# Patient Record
Sex: Male | Born: 1995 | Hispanic: Yes | Marital: Single | Smoking: Current every day smoker
Health system: Southern US, Community
[De-identification: ages and names within clinical notes are randomized; demographics above are authoritative.]

## PROBLEM LIST (undated history)

## (undated) DIAGNOSIS — S52502A Unspecified fracture of the lower end of left radius, initial encounter for closed fracture: Secondary | ICD-10-CM

## (undated) HISTORY — PX: NO PAST SURGERIES: SHX2092

---

## 2016-04-05 ENCOUNTER — Emergency Department (HOSPITAL_BASED_OUTPATIENT_CLINIC_OR_DEPARTMENT_OTHER)
Admission: EM | Admit: 2016-04-05 | Discharge: 2016-04-06 | Disposition: A | Discharge status: Home / Self Care | Payer: Self-pay | Attending: Emergency Medicine | Admitting: Emergency Medicine

## 2016-04-05 ENCOUNTER — Encounter (HOSPITAL_BASED_OUTPATIENT_CLINIC_OR_DEPARTMENT_OTHER): Payer: Self-pay | Admitting: Emergency Medicine

## 2016-04-05 DIAGNOSIS — F17200 Nicotine dependence, unspecified, uncomplicated: Secondary | ICD-10-CM | POA: Insufficient documentation

## 2016-04-05 DIAGNOSIS — Y939 Activity, unspecified: Secondary | ICD-10-CM | POA: Insufficient documentation

## 2016-04-05 DIAGNOSIS — S52502A Unspecified fracture of the lower end of left radius, initial encounter for closed fracture: Secondary | ICD-10-CM | POA: Insufficient documentation

## 2016-04-05 DIAGNOSIS — Y929 Unspecified place or not applicable: Secondary | ICD-10-CM | POA: Insufficient documentation

## 2016-04-05 DIAGNOSIS — Q7192 Unspecified reduction defect of left upper limb: Secondary | ICD-10-CM

## 2016-04-05 DIAGNOSIS — W109XXA Fall (on) (from) unspecified stairs and steps, initial encounter: Secondary | ICD-10-CM | POA: Insufficient documentation

## 2016-04-05 DIAGNOSIS — Y999 Unspecified external cause status: Secondary | ICD-10-CM | POA: Insufficient documentation

## 2016-04-05 HISTORY — DX: Unspecified fracture of the lower end of left radius, initial encounter for closed fracture: S52.502A

## 2016-04-05 MED ORDER — ONDANSETRON HCL 4 MG/2ML IJ SOLN
4.0000 mg | Freq: Once | INTRAMUSCULAR | Status: AC
  Administered 2016-04-05: 4 mg via INTRAVENOUS
  Filled 2016-04-05: qty 2

## 2016-04-05 MED ORDER — MORPHINE SULFATE (PF) 4 MG/ML IV SOLN
4.0000 mg | Freq: Once | INTRAVENOUS | Status: AC
  Administered 2016-04-06: 4 mg via INTRAVENOUS
  Filled 2016-04-05: qty 1

## 2016-04-05 NOTE — ED Notes (Addendum)
MD at bedside. 

## 2016-04-05 NOTE — ED Triage Notes (Signed)
Pt in c/o pain to L wrist after falling on the stairs approx 30 min PTA. Obvious deformity to L wrist. Pt alert, interactive, in NAD.

## 2016-04-05 NOTE — ED Provider Notes (Signed)
[facility] Note   CSN: [id number] Arrival date & time: 04/05/16  2339  By signing my name below, I, Phillis Haggis, attest that this documentation has been prepared under the direction and in the presence of Cathren Laine, MD. Electronically Signed: Phillis Haggis, ED Scribe. 04/05/16. 11:52 PM.  History   Chief Complaint Chief Complaint  Patient presents with  . Wrist Injury    The history is provided by the patient. No language interpreter was used.  HPI Comments: Kyle Anderson is a 20 y.o. male who presents to the Emergency Department complaining of a fall and left wrist injury occurring 30 minutes ago. Pt reports that he fell down some stairs and injured the wrist. There is an obvious deformity to the left wrist with associated swelling and pain. Pain moderate-severe, constant, dull, worse w movement. Pt is right hand dominant. He denies hitting head, chest pain, SOB, neck pain, back pain, dizziness, lightheadedness, numbness, weakness, or LOC. Pt denies allergies to medication.   History reviewed. No pertinent past medical history.  There are no active problems to display for this patient.   History reviewed. No pertinent surgical history.     Home Medications    Prior to Admission medications   Not on File    Family History History reviewed. No pertinent family history.  Social History Social History  Substance Use Topics  . Smoking status: Current Every Day Smoker  . Smokeless tobacco: Not on file  . Alcohol use No    Allergies   Review of patient's allergies indicates no known allergies.  Review of Systems Review of Systems  Constitutional: Negative for fever.  Respiratory: Negative for shortness of breath.   Cardiovascular: Negative for chest pain.  Gastrointestinal: Negative for abdominal pain.  Musculoskeletal: Positive for arthralgias and joint swelling.  Skin: Negative for wound.  Neurological: Negative for syncope, weakness,  numbness and headaches.  All other systems reviewed and are negative.    Physical Exam Updated Vital Signs BP 153/90   Pulse 80   Temp 98.1 F (36.7 C)   Resp 20   Wt 120 lb (54.4 kg)   SpO2 100%   Physical Exam  Constitutional: He appears well-developed and well-nourished.  HENT:  Head: Normocephalic and atraumatic.  Eyes: EOM are normal. Pupils are equal, round, and reactive to light.  Neck: Normal range of motion. Neck supple.  Cardiovascular: Normal rate, regular rhythm and normal heart sounds.  Exam reveals no gallop and no friction rub.   No murmur heard. Pulmonary/Chest: Effort normal and breath sounds normal. He has no wheezes.  Abdominal: Soft. There is no tenderness.  Musculoskeletal: Normal range of motion.       Left wrist: He exhibits tenderness, swelling and deformity.  CTLS spine, non tender, aligned, no step off.  Deformity to left wrist, skin is intact. Radial pulse 2+. Normal cap refill distally. No other focal bony tenderness on bil ext exam.    Neurological: He is alert.  LUE, rad/med/uln n fxn, motor and sens intact.   Ambulates w steady gait.   Skin: Skin is warm and dry.  Intact.   Psychiatric: He has a normal mood and affect. His behavior is normal.  Nursing note and vitals reviewed.  ED Treatments / Results  DIAGNOSTIC STUDIES: Oxygen Saturation is 100% on RA, normal by my interpretation.    COORDINATION OF CARE: 11:50 PM-Discussed treatment plan which includes x-ray with pt at bedside and pt agreed to plan.    Labs (  all labs ordered are listed, but only abnormal results are displayed) Labs Reviewed - No data to display  EKG  EKG Interpretation None       Radiology Dg Wrist 2 Views Left  Result Date: 04/06/2016 CLINICAL DATA:  Status post reduction of left wrist deformity. Initial encounter. EXAM: LEFT WRIST - 2 VIEW COMPARISON:  Left wrist radiographs performed earlier today at 12:01 a.m. FINDINGS: There is improved alignment of the  distal radial fracture, though approximately 1/2 shaft width dorsal displacement is still seen, with mild dorsal angulation and mild impaction. No new fractures are identified. The carpal rows appear grossly intact, and demonstrate normal alignment. Mild soft tissue swelling is noted about the wrist. Evaluation of the soft tissues is somewhat suboptimal due to the overlying splint. IMPRESSION: Improved alignment of distal radius fracture, though approximately 1/2 shaft width dorsal displacement is still seen, with mild dorsal angulation and mild impaction. Electronically Signed   By: Roanna RaiderJeffery  Chang M.D.   On: 04/06/2016 01:55   Dg Wrist Complete Left  Result Date: 04/06/2016 CLINICAL DATA:  20 year old male with fall. EXAM: LEFT WRIST - COMPLETE 3+ VIEW COMPARISON:  None. FINDINGS: There is a fracture of the distal radius with approximately half shaft thickness dorsal displacement and dorsal angulation of the distal fracture fragment. No other acute fracture identified. There is soft tissue swelling of the distal forearm and wrist. No radiopaque foreign object. IMPRESSION: Fracture of the distal radius with dorsal displacement and angulation of the distal fracture fragment. Electronically Signed   By: Elgie CollardArash  Radparvar M.D.   On: 04/06/2016 00:56    Procedures Procedures (including critical care time)  Medications Ordered in ED Medications - No data to display   Initial Impression / Assessment and Plan / ED Course  I have reviewed the triage vital signs and the nursing notes.  Pertinent labs & imaging results that were available during my care of the patient were reviewed by me and considered in my medical decision making (see chart for details).  Clinical Course    Iv ns. Morphine iv.  Xrays.  Reviewed nursing notes.  Hand surgery on call consulted, spoke with Dr Izora Ribasoley - he indicated if we were able to get improvement alignment, to splint and have f/u in the office in the next 1-2 days.     Pain improved, but persists. Dilaudid 1 mg iv.  Pain improved.  Hematoma block, sterile prep w betadine, block accomplished with 2% lidocaine, total 10 cc used. Good result. Pt tolerated reduction attempt well.  Skin remains intact. Radial pulse 2+  Post reduction, improved alignment from initial xray, however will still require surgical reduction/repair. sugartong splint applied by tech.    Recheck post splint, pain improved.  Radial pulse 2+, normal cap refill distally in digits. No numbness. Left hand, rad/med/uln n fxn intact, motor and sens  Patient currently stable for d/c.    Final Clinical Impressions(s) / ED Diagnoses   Final diagnoses:  None  I personally performed the services described in this documentation, which was scribed in my presence. The recorded information has been reviewed and considered. Cathren LaineKevin Jamare Vanatta, MD   New Prescriptions New Prescriptions   No medications on file     Cathren LaineKevin Sanjana Folz, MD 04/06/16 0330

## 2016-04-06 ENCOUNTER — Emergency Department (HOSPITAL_COMMUNITY)
Admission: EM | Admit: 2016-04-06 | Discharge: 2016-04-06 | Disposition: A | Discharge status: Home / Self Care | Payer: Self-pay | Attending: Emergency Medicine | Admitting: Emergency Medicine

## 2016-04-06 ENCOUNTER — Emergency Department (HOSPITAL_BASED_OUTPATIENT_CLINIC_OR_DEPARTMENT_OTHER): Payer: Self-pay

## 2016-04-06 ENCOUNTER — Encounter (HOSPITAL_COMMUNITY): Payer: Self-pay

## 2016-04-06 DIAGNOSIS — M25531 Pain in right wrist: Secondary | ICD-10-CM | POA: Insufficient documentation

## 2016-04-06 DIAGNOSIS — M25532 Pain in left wrist: Secondary | ICD-10-CM

## 2016-04-06 DIAGNOSIS — W19XXXD Unspecified fall, subsequent encounter: Secondary | ICD-10-CM | POA: Insufficient documentation

## 2016-04-06 DIAGNOSIS — F17210 Nicotine dependence, cigarettes, uncomplicated: Secondary | ICD-10-CM | POA: Insufficient documentation

## 2016-04-06 MED ORDER — HYDROCODONE-ACETAMINOPHEN 5-325 MG PO TABS: 1.0000 | ORAL_TABLET | Freq: Four times a day (QID) | ORAL | 0 refills | Status: DC | PRN

## 2016-04-06 MED ORDER — HYDROCODONE-ACETAMINOPHEN 5-325 MG PO TABS
2.0000 | ORAL_TABLET | Freq: Once | ORAL | Status: AC
  Administered 2016-04-06: 2 via ORAL
  Filled 2016-04-06: qty 2

## 2016-04-06 MED ORDER — HYDROMORPHONE HCL 1 MG/ML IJ SOLN
1.0000 mg | Freq: Once | INTRAMUSCULAR | Status: AC
  Administered 2016-04-06: 1 mg via INTRAVENOUS
  Filled 2016-04-06: qty 1

## 2016-04-06 MED ORDER — LIDOCAINE HCL 2 % IJ SOLN
20.0000 mL | Freq: Once | INTRAMUSCULAR | Status: AC
  Administered 2016-04-06: 400 mg
  Filled 2016-04-06: qty 20

## 2016-04-06 MED ORDER — MORPHINE SULFATE (PF) 4 MG/ML IV SOLN
4.0000 mg | Freq: Once | INTRAVENOUS | Status: AC
  Administered 2016-04-06: 4 mg via INTRAVENOUS
  Filled 2016-04-06: qty 1

## 2016-04-06 NOTE — ED Notes (Signed)
Pt fell down steps. Presents with obvious deformity to left wrist. Fingers cool. Radial pulse present per EDP. Moves fingers and thumb slightly. Extremity splinted and ice applied.

## 2016-04-06 NOTE — Discharge Instructions (Signed)
It was our pleasure to provide your ER care today - we hope that you feel better.  Keep splint clean and dry.  Keep your wrist elevated above heart level, as much as possible to help with pain and swelling. Icepack/cold to sore area.  Take motrin or aleve as need for pain. You may also take hydrocodone as need for pain. No driving for the next 6 hours or when taking hydrocodone. Also, do not take tylenol or acetaminophen containing medication when taking hydrocodone.  You will require surgery in order that your wrist injury/fracture heals, and so that you have as close to normal function of your wrist and hand as possible - as such, you must follow up with hand/wrist specialist in the next couple days - see referral - call office this AM to arrange appointment.   When you call the office, tell them that you were in the ER tonight, and that we discussed your case with Dr Izora Ribasoley.  Your blood pressure is high tonight - follow up with primary care doctor in the next 2-3 weeks for recheck.  Return to ER if worse, new symptoms, numbness/weakness, intractable pain, other concern.

## 2016-04-06 NOTE — ED Triage Notes (Addendum)
Pt reports he fell last night and had wrist injury. [facility] Pt reports pain and tingling in the finger at this time. Pt reports he has pain and as unable to pick up his prescription. Pt able to move fingers, good cap refill noted, no abnormal color of the digits.

## 2016-04-06 NOTE — ED Notes (Signed)
Declined W/C at D/C and was escorted to lobby by RN. 

## 2016-04-06 NOTE — ED Notes (Signed)
Patient transported to X-ray 

## 2016-04-06 NOTE — ED Notes (Signed)
MD at bedside. 

## 2016-04-06 NOTE — ED Notes (Signed)
Pt c/o pain. Rates pain 10/10. MD aware and orders received.

## 2016-04-06 NOTE — ED Notes (Signed)
Juice given

## 2016-04-06 NOTE — Progress Notes (Signed)
[facility].  Per NP Katrinka BlazingSmith, patient tried to make appointment today but was denied due to patient not having large sum of money to pay up front.  Patient without insurance.  NP reports patient will need surgery and that Dr. Izora Ribasoley wanted to see patient in his office for follow up.  [facility] Dr. Izora Ribasoley to see if he could assist in this matter.  No further EDCM needs at this time.

## 2016-04-06 NOTE — ED Provider Notes (Signed)
[facility] Provider Note   CSN: [id number] Arrival date & time: 04/06/16  1410   By signing my name below, I, Clovis Pu, attest that this documentation has been prepared under the direction and in the presence of  Felicie Morn, NP. Electronically Signed: Clovis Pu, ED Scribe. 04/06/16. 5:11 PM.   History   Chief Complaint Chief Complaint  Patient presents with  . Arm Pain     The history is provided by the patient. No language interpreter was used.  Arm Pain  This is a new problem. The current episode started yesterday. The problem has not changed since onset.  HPI Comments:  Kyle Anderson is a 20 y.o. male who presents to the Emergency Department complaining of mild numbness and tingling to his fingers of his LUE. Pt denies hitting his hand on anything since his recent wrist injury. He visited [facility] and was seen by Dr. Denton Lank for the wrist injury and was referred to Dr. Izora Ribas for follow up within the next 1-2 days. Pt states he called Dr. Izora Ribas and again today and was told he could not come in if he did not have any means to pay. He has not picked up his prescription medication to alleviate his pain.   History reviewed. No pertinent past medical history.  There are no active problems to display for this patient.   History reviewed. No pertinent surgical history.     Home Medications    Prior to Admission medications   Medication Sig Start Date End Date Taking? Authorizing Provider  HYDROcodone-acetaminophen (NORCO/VICODIN) 5-325 MG tablet Take 1-2 tablets by mouth every 6 (six) hours as needed for moderate pain. 04/06/16   Cathren Laine, MD    Family History History reviewed. No pertinent family history.  Social History Social History  Substance Use Topics  . Smoking status: Current Every Day Smoker    Packs/day: 0.50    Types: Cigarettes  . Smokeless tobacco: Never Used  . Alcohol use No     Allergies   Review of patient's allergies indicates no  known allergies.   Review of Systems Review of Systems  Constitutional: Negative for fever.  Neurological: Negative for numbness.  All other systems reviewed and are negative.    Physical Exam Updated Vital Signs BP 138/80 (BP Location: Left Arm)   Pulse 89   Temp 98.5 F (36.9 C) (Oral)   Resp 16   SpO2 98%   Physical Exam  Constitutional: He is oriented to person, place, and time. He appears well-developed and well-nourished. No distress.  HENT:  Head: Normocephalic and atraumatic.  Eyes: Conjunctivae are normal.  Cardiovascular: Normal rate.   Pulmonary/Chest: Effort normal.  Abdominal: He exhibits no distension.  Musculoskeletal:  Intact splint on L forearm. Sensation and movement intact. Pt reports mild numbness.   Neurological: He is alert and oriented to person, place, and time.  Skin: Skin is warm and dry.   Brisk capillary refill.   Psychiatric: He has a normal mood and affect.  Nursing note and vitals reviewed.    ED Treatments / Results  DIAGNOSTIC STUDIES:  Oxygen Saturation is 98% on RA, normal by my interpretation.    COORDINATION OF CARE:  5:04 PM Discussed treatment plan with pt at bedside and pt agreed to plan.  Labs (all labs ordered are listed, but only abnormal results are displayed) Labs Reviewed - No data to display  EKG  EKG Interpretation None       Radiology Dg Wrist 2 Views  Left  Result Date: 04/06/2016 CLINICAL DATA:  Status post reduction of left wrist deformity. Initial encounter. EXAM: LEFT WRIST - 2 VIEW COMPARISON:  Left wrist radiographs performed earlier today at 12:01 a.m. FINDINGS: There is improved alignment of the distal radial fracture, though approximately 1/2 shaft width dorsal displacement is still seen, with mild dorsal angulation and mild impaction. No new fractures are identified. The carpal rows appear grossly intact, and demonstrate normal alignment. Mild soft tissue swelling is noted about the wrist.  Evaluation of the soft tissues is somewhat suboptimal due to the overlying splint. IMPRESSION: Improved alignment of distal radius fracture, though approximately 1/2 shaft width dorsal displacement is still seen, with mild dorsal angulation and mild impaction. Electronically Signed   By: Roanna RaiderJeffery  Chang M.D.   On: 04/06/2016 01:55   Dg Wrist Complete Left  Result Date: 04/06/2016 CLINICAL DATA:  20 year old male with fall. EXAM: LEFT WRIST - COMPLETE 3+ VIEW COMPARISON:  None. FINDINGS: There is a fracture of the distal radius with approximately half shaft thickness dorsal displacement and dorsal angulation of the distal fracture fragment. No other acute fracture identified. There is soft tissue swelling of the distal forearm and wrist. No radiopaque foreign object. IMPRESSION: Fracture of the distal radius with dorsal displacement and angulation of the distal fracture fragment. Electronically Signed   By: Elgie CollardArash  Radparvar M.D.   On: 04/06/2016 00:56    Procedures Procedures (including critical care time)  Medications Ordered in ED Medications - No data to display   Initial Impression / Assessment and Plan / ED Course  I have reviewed the triage vital signs and the nursing notes.  Pertinent labs & imaging results that were available during my care of the patient were reviewed by me and considered in my medical decision making (see chart for details).  Clinical Course  Patient was seen yesterday for treatment of wrist fracture. Reduction was performed and splint applied. Patient reporting mild tingling of fingers today. Hand and fingers are warm to touch, brisk capillary refill. Normal sensation. Splint assessed and re-secured by orthopedic tech.  Patient to follow-up with Dr. Izora Ribasoley. Case management will assist patient in setting up appointment.    Final Clinical Impressions(s) / ED Diagnoses   Final diagnoses:  Left wrist pain    New Prescriptions New Prescriptions   No medications on  file  I personally performed the services described in this documentation, which was scribed in my presence. The recorded information has been reviewed and is accurate.     Felicie Mornavid Helmut Hennon, NP 04/07/16 [id number]    Loren Raceravid Yelverton, MD 04/08/16 [phone]

## 2016-04-08 ENCOUNTER — Telehealth: Payer: Self-pay | Admitting: General Practice

## 2016-04-10 ENCOUNTER — Other Ambulatory Visit: Payer: Self-pay | Admitting: Orthopedic Surgery

## 2016-04-10 ENCOUNTER — Encounter (HOSPITAL_BASED_OUTPATIENT_CLINIC_OR_DEPARTMENT_OTHER): Payer: Self-pay | Admitting: *Deleted

## 2016-04-11 NOTE — H&P (Signed)
Kyle GoldsRuben Bonenberger is an 20 y.o. male.   Chief Complaint: left wrist injury HPI: This patient is a 20 year old male who fell down some steps on 04-05-16, injuring his left wrist.  He presented to the emergency department for evaluation, was provisionally reduced and placed into a splint.  He had difficulty obtaining follow-up, and return to the emergency department when I was on call.  He was referred to my office, and was unable to keep appointments that were scheduled for him on Thursday, 04-09-16 and also on Friday, 04-10-16.  I spoke with him via telephone on Friday, and we discussed the particulars of his injury, options for treatment, etc. and he indicated that he did wish to proceed with operative treatment and was comfortable with us meeting face-to-face for the first time on the day of surgery.  Past Medical History:  Diagnosis Date  . Distal radius fracture, left 04/05/2016    Past Surgical History:  Procedure Laterality Date  . NO PAST SURGERIES      History reviewed. No pertinent family history. Social History:  reports that he has been smoking Cigarettes.  He has a 1.50 pack-year smoking history. He has never used smokeless tobacco. He reports that he drinks alcohol. He reports that he does not use drugs.  Allergies: No Known Allergies  No prescriptions prior to admission.    No results found for this or any previous visit (from the past 48 hour(s)). No results found.  Review of Systems  All other systems reviewed and are negative.   Height 5\' 10"  (1.778 m), weight 59 kg (130 lb). Physical Exam  Constitutional: He is oriented to person, place, and time. He appears well-developed and well-nourished.  HENT:  Head: Normocephalic and atraumatic.  Eyes: Pupils are equal, round, and reactive to light.  Neck: Normal range of motion.  Cardiovascular: Intact distal pulses.   Respiratory: Effort normal and breath sounds normal.  GI: Soft.  Musculoskeletal:  Plint in place on left  upper extremity. Digit swollen, with some subjective numbness, but intact light touch sensibility  In the radial, median, and ulnar nerve distributions with intact motor to the same.  No tenderness about the elbow.  Once the splint was removed, the wrist was noted to be swollen and tender at the distal radius with visible degree of deformity.  Neurological: He is alert and oriented to person, place, and time. He has normal reflexes.  Skin: Skin is warm and dry.  Psychiatric: He has a normal mood and affect. His behavior is normal. Judgment and thought content normal.     Assessment/Plan This patient has a comminuted and dorsally displaced left distal radius fracture that is closed.  I again reviewed with him the indications for surgery to obtain and maintain a more anatomic alignment so that he can optimize restoration of function. The details of the operative procedure were again discussed with the patient.  Questions were invited and answered.  In addition to the goal of the procedure, the risks of the procedure to include but not limited to bleeding; infection; damage to the nerves or blood vessels that could result in bleeding, numbness, weakness, chronic pain, and the need for additional procedures; stiffness; the need for revision surgery; and anesthetic risks were reviewed.  No specific outcome was guaranteed or implied.  Informed consent was obtained.  Riven Mabile A., MD 04/11/2016, 9:14 AM

## 2016-04-13 ENCOUNTER — Ambulatory Visit (HOSPITAL_BASED_OUTPATIENT_CLINIC_OR_DEPARTMENT_OTHER)
Admission: RE | Admit: 2016-04-13 | Discharge: 2016-04-13 | Disposition: A | Discharge status: Home / Self Care | Payer: Self-pay | Source: Ambulatory Visit | Attending: Orthopedic Surgery | Admitting: Orthopedic Surgery

## 2016-04-13 ENCOUNTER — Ambulatory Visit (HOSPITAL_BASED_OUTPATIENT_CLINIC_OR_DEPARTMENT_OTHER): Payer: Self-pay | Admitting: Certified Registered"

## 2016-04-13 ENCOUNTER — Encounter (HOSPITAL_BASED_OUTPATIENT_CLINIC_OR_DEPARTMENT_OTHER)
Admission: RE | Disposition: A | Discharge status: Home / Self Care | Payer: Self-pay | Source: Ambulatory Visit | Attending: Orthopedic Surgery

## 2016-04-13 ENCOUNTER — Encounter (HOSPITAL_BASED_OUTPATIENT_CLINIC_OR_DEPARTMENT_OTHER): Payer: Self-pay

## 2016-04-13 ENCOUNTER — Ambulatory Visit (HOSPITAL_COMMUNITY): Payer: Self-pay

## 2016-04-13 DIAGNOSIS — F17200 Nicotine dependence, unspecified, uncomplicated: Secondary | ICD-10-CM | POA: Insufficient documentation

## 2016-04-13 DIAGNOSIS — S59202A Unspecified physeal fracture of lower end of radius, left arm, initial encounter for closed fracture: Secondary | ICD-10-CM | POA: Insufficient documentation

## 2016-04-13 DIAGNOSIS — W109XXA Fall (on) (from) unspecified stairs and steps, initial encounter: Secondary | ICD-10-CM | POA: Insufficient documentation

## 2016-04-13 DIAGNOSIS — T148XXA Other injury of unspecified body region, initial encounter: Secondary | ICD-10-CM

## 2016-04-13 HISTORY — PX: OPEN REDUCTION INTERNAL FIXATION (ORIF) DISTAL RADIAL FRACTURE: SHX5989

## 2016-04-13 HISTORY — DX: Unspecified fracture of the lower end of left radius, initial encounter for closed fracture: S52.502A

## 2016-04-13 SURGERY — OPEN REDUCTION INTERNAL FIXATION (ORIF) DISTAL RADIUS FRACTURE
Anesthesia: General | Site: Wrist | Laterality: Left

## 2016-04-13 MED ORDER — GLYCOPYRROLATE 0.2 MG/ML IJ SOLN
0.2000 mg | Freq: Once | INTRAMUSCULAR | Status: DC | PRN
Start: 2016-04-13 — End: 2016-04-13

## 2016-04-13 MED ORDER — LIDOCAINE HCL (CARDIAC) 20 MG/ML IV SOLN
INTRAVENOUS | Status: DC | PRN
  Administered 2016-04-13: 30 mg via INTRAVENOUS

## 2016-04-13 MED ORDER — MIDAZOLAM HCL 2 MG/2ML IJ SOLN
1.0000 mg | INTRAMUSCULAR | Status: DC | PRN
  Administered 2016-04-13: 2 mg via INTRAVENOUS
  Administered 2016-04-13: 1 mg via INTRAVENOUS

## 2016-04-13 MED ORDER — LACTATED RINGERS IV SOLN: INTRAVENOUS | Status: DC

## 2016-04-13 MED ORDER — OXYCODONE HCL 5 MG PO TABS: 5.0000 mg | ORAL_TABLET | Freq: Once | ORAL | Status: DC | PRN

## 2016-04-13 MED ORDER — 0.9 % SODIUM CHLORIDE (POUR BTL) OPTIME
TOPICAL | Status: DC | PRN
  Administered 2016-04-13: 1000 mL

## 2016-04-13 MED ORDER — DEXAMETHASONE SODIUM PHOSPHATE 10 MG/ML IJ SOLN
INTRAMUSCULAR | Status: DC | PRN
  Administered 2016-04-13: 10 mg via INTRAVENOUS

## 2016-04-13 MED ORDER — ONDANSETRON HCL 4 MG/2ML IJ SOLN
INTRAMUSCULAR | Status: DC | PRN
  Administered 2016-04-13: 4 mg via INTRAVENOUS

## 2016-04-13 MED ORDER — DEXAMETHASONE SODIUM PHOSPHATE 10 MG/ML IJ SOLN
INTRAMUSCULAR | Status: AC
  Filled 2016-04-13: qty 1

## 2016-04-13 MED ORDER — MIDAZOLAM HCL 2 MG/2ML IJ SOLN
INTRAMUSCULAR | Status: AC
  Filled 2016-04-13: qty 2

## 2016-04-13 MED ORDER — CEFAZOLIN SODIUM-DEXTROSE 2-4 GM/100ML-% IV SOLN
2.0000 g | INTRAVENOUS | Status: AC
  Administered 2016-04-13: 2 g via INTRAVENOUS

## 2016-04-13 MED ORDER — MIDAZOLAM HCL 2 MG/2ML IJ SOLN
INTRAMUSCULAR | Status: AC
Start: 2016-04-13 — End: 2016-04-13
  Filled 2016-04-13: qty 2

## 2016-04-13 MED ORDER — PROPOFOL 10 MG/ML IV BOLUS
INTRAVENOUS | Status: DC | PRN
  Administered 2016-04-13: 200 mg via INTRAVENOUS

## 2016-04-13 MED ORDER — SCOPOLAMINE 1 MG/3DAYS TD PT72: 1.0000 | MEDICATED_PATCH | Freq: Once | TRANSDERMAL | Status: DC | PRN

## 2016-04-13 MED ORDER — ONDANSETRON HCL 4 MG/2ML IJ SOLN
INTRAMUSCULAR | Status: AC
  Filled 2016-04-13: qty 2

## 2016-04-13 MED ORDER — OXYCODONE HCL 5 MG/5ML PO SOLN: 5.0000 mg | Freq: Once | ORAL | Status: DC | PRN

## 2016-04-13 MED ORDER — FENTANYL CITRATE (PF) 100 MCG/2ML IJ SOLN
50.0000 ug | INTRAMUSCULAR | Status: DC | PRN
  Administered 2016-04-13: 100 ug via INTRAVENOUS

## 2016-04-13 MED ORDER — PROMETHAZINE HCL 25 MG/ML IJ SOLN: 6.2500 mg | INTRAMUSCULAR | Status: DC | PRN

## 2016-04-13 MED ORDER — LACTATED RINGERS IV SOLN
INTRAVENOUS | Status: DC
  Administered 2016-04-13 (×2): via INTRAVENOUS

## 2016-04-13 MED ORDER — LIDOCAINE 2% (20 MG/ML) 5 ML SYRINGE
INTRAMUSCULAR | Status: AC
  Filled 2016-04-13: qty 5

## 2016-04-13 MED ORDER — OXYCODONE HCL 5 MG PO TABS: 5.0000 mg | ORAL_TABLET | Freq: Four times a day (QID) | ORAL | 0 refills | Status: AC | PRN

## 2016-04-13 MED ORDER — BUPIVACAINE-EPINEPHRINE (PF) 0.5% -1:200000 IJ SOLN
INTRAMUSCULAR | Status: DC | PRN
  Administered 2016-04-13: 25 mL via PERINEURAL

## 2016-04-13 MED ORDER — HYDROMORPHONE HCL 1 MG/ML IJ SOLN: 0.2500 mg | INTRAMUSCULAR | Status: DC | PRN

## 2016-04-13 MED ORDER — CEFAZOLIN SODIUM-DEXTROSE 2-4 GM/100ML-% IV SOLN
INTRAVENOUS | Status: AC
  Filled 2016-04-13: qty 100

## 2016-04-13 MED ORDER — KETOROLAC TROMETHAMINE 30 MG/ML IJ SOLN
30.0000 mg | Freq: Once | INTRAMUSCULAR | Status: DC | PRN
Start: 2016-04-13 — End: 2016-04-13

## 2016-04-13 MED ORDER — FENTANYL CITRATE (PF) 100 MCG/2ML IJ SOLN
INTRAMUSCULAR | Status: AC
  Filled 2016-04-13: qty 2

## 2016-04-13 MED ORDER — IBUPROFEN 200 MG PO TABS: 600.0000 mg | ORAL_TABLET | Freq: Four times a day (QID) | ORAL | 0 refills | Status: AC | PRN

## 2016-04-13 SURGICAL SUPPLY — 67 items
BANDAGE COBAN STERILE 2 (GAUZE/BANDAGES/DRESSINGS) IMPLANT
BIT DRILL SOLID 2.0X40MM (BIT) IMPLANT
BIT DRILL SOLID 2.5X40MM (BIT) IMPLANT
BLADE MINI RND TIP GREEN BEAV (BLADE) IMPLANT
BLADE SURG 15 STRL LF DISP TIS (BLADE) ×1 IMPLANT
BLADE SURG 15 STRL SS (BLADE) ×2
BNDG COHESIVE 4X5 TAN STRL (GAUZE/BANDAGES/DRESSINGS) ×3 IMPLANT
BNDG ESMARK 4X9 LF (GAUZE/BANDAGES/DRESSINGS) ×3 IMPLANT
BNDG GAUZE ELAST 4 BULKY (GAUZE/BANDAGES/DRESSINGS) ×3 IMPLANT
BRUSH SCRUB EZ PLAIN DRY (MISCELLANEOUS) IMPLANT
CANISTER SUCT 1200ML W/VALVE (MISCELLANEOUS) ×3 IMPLANT
CHLORAPREP W/TINT 26ML (MISCELLANEOUS) ×3 IMPLANT
CORDS BIPOLAR (ELECTRODE) ×3 IMPLANT
COVER BACK TABLE 60X90IN (DRAPES) ×3 IMPLANT
COVER MAYO STAND STRL (DRAPES) ×3 IMPLANT
CUFF TOURNIQUET SINGLE 18IN (TOURNIQUET CUFF) IMPLANT
CUFF TOURNIQUET SINGLE 24IN (TOURNIQUET CUFF) IMPLANT
DRAPE C-ARM 42X72 X-RAY (DRAPES) ×3 IMPLANT
DRAPE EXTREMITY T 121X128X90 (DRAPE) ×3 IMPLANT
DRAPE SURG 17X23 STRL (DRAPES) ×3 IMPLANT
DRILL SOLID 2.0X40MM (BIT)
DRILL SOLID 2.5X40MM (BIT)
DRIVER, AO CONNECTION, SQUARE TIP 2.0 MM ×6 IMPLANT
DRSG ADAPTIC 3X8 NADH LF (GAUZE/BANDAGES/DRESSINGS) ×3 IMPLANT
DRSG EMULSION OIL 3X3 NADH (GAUZE/BANDAGES/DRESSINGS) IMPLANT
ELECT REM PT RETURN 9FT ADLT (ELECTROSURGICAL) ×3
ELECTRODE REM PT RTRN 9FT ADLT (ELECTROSURGICAL) ×1 IMPLANT
GLOVE BIO SURGEON STRL SZ7.5 (GLOVE) ×3 IMPLANT
GLOVE BIOGEL PI IND STRL 7.0 (GLOVE) ×1 IMPLANT
GLOVE BIOGEL PI IND STRL 8 (GLOVE) ×1 IMPLANT
GLOVE BIOGEL PI INDICATOR 7.0 (GLOVE) ×2
GLOVE BIOGEL PI INDICATOR 8 (GLOVE) ×2
GLOVE ECLIPSE 6.5 STRL STRAW (GLOVE) ×3 IMPLANT
GOWN STRL REUS W/ TWL LRG LVL3 (GOWN DISPOSABLE) ×2 IMPLANT
GOWN STRL REUS W/TWL LRG LVL3 (GOWN DISPOSABLE) ×4
GOWN STRL REUS W/TWL XL LVL3 (GOWN DISPOSABLE) ×3 IMPLANT
GUIDE AIMING 1.5MM (WIRE) ×3 IMPLANT
NEEDLE HYPO 25X1 1.5 SAFETY (NEEDLE) IMPLANT
NS IRRIG 1000ML POUR BTL (IV SOLUTION) ×3 IMPLANT
PACK BASIN DAY SURGERY FS (CUSTOM PROCEDURE TRAY) ×3 IMPLANT
PADDING CAST ABS 4INX4YD NS (CAST SUPPLIES)
PADDING CAST ABS COTTON 4X4 ST (CAST SUPPLIES) IMPLANT
PENCIL BUTTON HOLSTER BLD 10FT (ELECTRODE) ×3 IMPLANT
RUBBERBAND STERILE (MISCELLANEOUS) IMPLANT
SKELETAL DYNAMICS DVR SET (Set) ×3 IMPLANT
SLEEVE SCD COMPRESS KNEE MED (MISCELLANEOUS) ×3 IMPLANT
SLING ARM FOAM STRAP LRG (SOFTGOODS) IMPLANT
SPLINT PLASTER CAST XFAST 3X15 (CAST SUPPLIES) IMPLANT
SPLINT PLASTER XTRA FASTSET 3X (CAST SUPPLIES)
SPONGE GAUZE 4X4 12PLY STER LF (GAUZE/BANDAGES/DRESSINGS) ×3 IMPLANT
STOCKINETTE 6  STRL (DRAPES) ×2
STOCKINETTE 6 STRL (DRAPES) ×1 IMPLANT
SUCTION FRAZIER HANDLE 10FR (MISCELLANEOUS) ×2
SUCTION TUBE FRAZIER 10FR DISP (MISCELLANEOUS) ×1 IMPLANT
SUT VIC AB 2-0 PS2 27 (SUTURE) ×3 IMPLANT
SUT VICRYL 4-0 PS2 18IN ABS (SUTURE) IMPLANT
SUT VICRYL RAPIDE 4-0 (SUTURE) IMPLANT
SUT VICRYL RAPIDE 4/0 PS 2 (SUTURE) ×3 IMPLANT
SYR BULB 3OZ (MISCELLANEOUS) ×3 IMPLANT
SYRINGE 10CC LL (SYRINGE) IMPLANT
TOWEL OR 17X24 6PK STRL BLUE (TOWEL DISPOSABLE) ×3 IMPLANT
TOWEL OR NON WOVEN STRL DISP B (DISPOSABLE) ×3 IMPLANT
TUBE CONNECTING 20'X1/4 (TUBING) ×1
TUBE CONNECTING 20X1/4 (TUBING) ×2 IMPLANT
UNDERPAD 30X30 (UNDERPADS AND DIAPERS) ×3 IMPLANT
WIRE FIX 1.5 STANDARD TIP (WIRE)
WIRE FIX 1.5 STD TIP (WIRE) IMPLANT

## 2016-04-13 NOTE — Anesthesia Procedure Notes (Signed)
Procedure Name: LMA Insertion Date/Time: 04/13/2016 10:52 AM Performed by: Danaisha Celli D Pre-anesthesia Checklist: Patient identified, Emergency Drugs available, Suction available and Patient being monitored Patient Re-evaluated:Patient Re-evaluated prior to inductionOxygen Delivery Method: Circle system utilized Preoxygenation: Pre-oxygenation with 100% oxygen Intubation Type: IV induction Ventilation: Mask ventilation without difficulty LMA: LMA inserted LMA Size: 4.0 Number of attempts: 1 Airway Equipment and Method: Bite block Placement Confirmation: positive ETCO2 Tube secured with: Tape Dental Injury: Teeth and Oropharynx as per pre-operative assessment

## 2016-04-13 NOTE — Op Note (Signed)
04/13/2016  9:52 AM  PATIENT:  Kyle Anderson  20 y.o. male  PRE-OPERATIVE DIAGNOSIS:  Displaced left distal radius fracture  POST-OPERATIVE DIAGNOSIS:  Same  PROCEDURE:  ORIF left distal radius fracture  SURGEON: Cliffton Astersavid A. Janee Mornhompson, MD  PHYSICIAN ASSISTANT: Danielle RankinKirsten Schrader, OPA-C  ANESTHESIA:  regional and general  SPECIMENS:  None  DRAINS: None  EBL:  less than 50 mL  PREOPERATIVE INDICATIONS:  Kyle Anderson is a  20 y.o. male with a displaced left distal radius fracture  The risks benefits and alternatives were discussed with the patient preoperatively including but not limited to the risks of infection, bleeding, nerve injury, cardiopulmonary complications, the need for revision surgery, among others, and the patient verbalized understanding and consented to proceed.  OPERATIVE IMPLANTS: [employer] Geminus plate/screws/pegs  OPERATIVE PROCEDURE: After receiving prophylactic antibiotics and a regional block, the patient was escorted to the operative theatre and placed in a supine position.  General anesthesia was administered.  A surgical "time-out" was performed during which the planned procedure, proposed operative site, and the correct patient identity were compared to the operative consent and agreement confirmed by the circulating nurse according to current facility policy. Following application of a tourniquet to the operative extremity, the exposed skin was pre-scrubbed with a Hibiclens scrub brush and then was prepped with Chloraprep and draped in the usual sterile fashion. The limb was exsanguinated with an Esmarch bandage and the tourniquet inflated to approximately 100mmHg higher than systolic BP.   A sinusoidal-shaped incision was marked and made over the FCR axis and the distal forearm. The skin was incised sharply with scalpel, subcutaneous tissues with blunt and spreading dissection. The FCR axis was exploited deeply. The pronator quadratus was reflected in an  L-shaped ulnarly. The fracture was inspected and provisionally reduced.  This was confirmed fluoroscopically. The appropriately sized plate was selected and found to fit well. It was placed in its provisional alignment of the radius and this was confirmed fluoroscopically.  It was secured to the radius with a screw through the slotted hole.  Additional adjustments were made as necessary, and the distal holes were all drilled and filled.  Peg/screw length distally was selected on the shorter side of measurements to minimize the risk for dorsal cortical penetration. The remainder of the proximal holes were drilled and filled.   Final images were obtained and the DRUJ was examined for stability. It was found to be sufficiently stable. The wound was then copiously irrigated and the PQ repaired with 2-0 Vicryl suture. Tourniquet was released and additional hemostasis obtained and the skin was closed with 2-0 Vicryl deep dermal buried sutures followed by running 4-0 Vicryl Rapide horizontal mattress suture in the skin. A bulky dressing with a volar plaster component was applied and she was taken to room stable condition.  DISPOSITION: The patient will be discharged home today with typical post-op instructions, returning in 10-15 days for reevaluation with new x-rays of the affected wrist out of the splint to include an inclined lateral and then transitioning to a removeable wrist splint and beginning wrist ROM exercises

## 2016-04-13 NOTE — Transfer of Care (Signed)
Immediate Anesthesia Transfer of Care Note  Patient: Kyle Anderson  Procedure(s) Performed: Procedure(s) with comments: OPEN REDUCTION INTERNAL FIXATION (ORIF) DISTAL RADIAL FRACTURE (Left) - GENERAL ANESTHESIA WITH PRE-OP BLOCK  Patient Location: PACU  Anesthesia Type:GA combined with regional for post-op pain  Level of Consciousness: awake and patient cooperative  Airway & Oxygen Therapy: Patient Spontanous Breathing and Patient connected to face mask oxygen  Post-op Assessment: Report given to RN and Post -op Vital signs reviewed and stable  Post vital signs: Reviewed and stable  Last Vitals:  Vitals:   04/13/16 1045 04/13/16 1153  BP:  (!) 101/46  Pulse:  (!) 53  Resp: 17 11  Temp:      Last Pain:  Vitals:   04/13/16 1013  TempSrc: Oral  PainSc: 7          Complications: No apparent anesthesia complications

## 2016-04-13 NOTE — Anesthesia Procedure Notes (Signed)
Anesthesia Regional Block:  Supraclavicular block  Pre-Anesthetic Checklist: ,, timeout performed, Correct Patient, Correct Site, Correct Laterality, Correct Procedure, Correct Position, site marked, Risks and benefits discussed,  Surgical consent,  Pre-op evaluation,  At surgeon's request and post-op pain management  Laterality: Left  Prep: chloraprep       Needles:  Injection technique: Single-shot  Needle Type: Echogenic Needle     Needle Length: 9cm 9 cm Needle Gauge: 21 G    Additional Needles:  Procedures: ultrasound guided (picture in chart) Supraclavicular block Narrative:  Injection made incrementally with aspirations every 5 mL.  Performed by: Personally  Anesthesiologist: Maeci Kalbfleisch  Additional Notes: Patient tolerated the procedure well without complications      

## 2016-04-13 NOTE — Anesthesia Preprocedure Evaluation (Signed)
Anesthesia Evaluation  Patient identified by MRN, date of birth, ID band Patient awake    Reviewed: Allergy & Precautions, NPO status , Patient's Chart, lab work & pertinent test results  Airway Mallampati: II  TM Distance: >3 FB Neck ROM: Full    Dental no notable dental hx.    Pulmonary Current Smoker,    Pulmonary exam normal breath sounds clear to auscultation       Cardiovascular negative cardio ROS Normal cardiovascular exam Rhythm:Regular Rate:Normal     Neuro/Psych negative neurological ROS  negative psych ROS   GI/Hepatic negative GI ROS, Neg liver ROS,   Endo/Other  negative endocrine ROS  Renal/GU negative Renal ROS  negative genitourinary   Musculoskeletal negative musculoskeletal ROS (+)   Abdominal   Peds negative pediatric ROS (+)  Hematology negative hematology ROS (+)   Anesthesia Other Findings   Reproductive/Obstetrics negative OB ROS                            Anesthesia Physical Anesthesia Plan  ASA: II  Anesthesia Plan: General   Post-op Pain Management: GA combined w/ Regional for post-op pain   Induction: Intravenous  Airway Management Planned: LMA  Additional Equipment:   Intra-op Plan:   Post-operative Plan: Extubation in OR  Informed Consent: I have reviewed the patients History and Physical, chart, labs and discussed the procedure including the risks, benefits and alternatives for the proposed anesthesia with the patient or authorized representative who has indicated his/her understanding and acceptance.   Dental advisory given  Plan Discussed with: CRNA and Surgeon  Anesthesia Plan Comments:        Anesthesia Quick Evaluation  

## 2016-04-13 NOTE — Discharge Instructions (Signed)
Discharge Instructions   You have a dressing with a plaster splint incorporated in it. Move your fingers as much as possible, making a full fist and fully opening the fist. Elevate your hand to reduce pain & swelling of the digits.  Ice over the operative site may be helpful to reduce pain & swelling.  DO NOT USE HEAT. Pain medicine has been prescribed for you.  Use your medicine as needed over the first 48 hours, and then you can begin to taper your use.  You may use Tylenol in place of your prescribed pain medication, but not IN ADDITION to it. Leave the dressing in place until you return to our office.  You may shower, but keep the bandage clean & dry.  You may drive a car when you are off of prescription pain medications and can safely control your vehicle with both hands. Call our office for an appointment for 10-15 days from the date of surgery.   Please call [phone] during normal business hours or [phone] after hours for any problems. Including the following:  - excessive redness of the incisions - drainage for more than 4 days - fever of more than 101.5 F  *Please note that pain medications will not be refilled after hours or on weekends.   Post Anesthesia Home Care Instructions  Activity: Get plenty of rest for the remainder of the day. A responsible adult should stay with you for 24 hours following the procedure.  For the next 24 hours, DO NOT: -Drive a car -Advertising copywriterperate machinery -Drink alcoholic beverages -Take any medication unless instructed by your physician -Make any legal decisions or sign important papers.  Meals: Start with liquid foods such as gelatin or soup. Progress to regular foods as tolerated. Avoid greasy, spicy, heavy foods. If nausea and/or vomiting occur, drink only clear liquids until the nausea and/or vomiting subsides. Call your physician if vomiting continues.  Special Instructions/Symptoms: Your throat may feel dry or sore from the  anesthesia or the breathing tube placed in your throat during surgery. If this causes discomfort, gargle with warm salt water. The discomfort should disappear within 24 hours.  If you had a scopolamine patch placed behind your ear for the management of post- operative nausea and/or vomiting:  1. The medication in the patch is effective for 72 hours, after which it should be removed.  Wrap patch in a tissue and discard in the trash. Wash hands thoroughly with soap and water. 2. You may remove the patch earlier than 72 hours if you experience unpleasant side effects which may include dry mouth, dizziness or visual disturbances. 3. Avoid touching the patch. Wash your hands with soap and water after contact with the patch.    Post Anesthesia Home Care Instructions  Activity: Get plenty of rest for the remainder of the day. A responsible adult should stay with you for 24 hours following the procedure.  For the next 24 hours, DO NOT: -Drive a car -Advertising copywriterperate machinery -Drink alcoholic beverages -Take any medication unless instructed by your physician -Make any legal decisions or sign important papers.  Meals: Start with liquid foods such as gelatin or soup. Progress to regular foods as tolerated. Avoid greasy, spicy, heavy foods. If nausea and/or vomiting occur, drink only clear liquids until the nausea and/or vomiting subsides. Call your physician if vomiting continues.  Special Instructions/Symptoms: Your throat may feel dry or sore from the anesthesia or the breathing tube placed in your throat during surgery. If this causes discomfort,  gargle with warm salt water. The discomfort should disappear within 24 hours.  If you had a scopolamine patch placed behind your ear for the management of post- operative nausea and/or vomiting:  1. The medication in the patch is effective for 72 hours, after which it should be removed.  Wrap patch in a tissue and discard in the trash. Wash hands thoroughly with  soap and water. 2. You may remove the patch earlier than 72 hours if you experience unpleasant side effects which may include dry mouth, dizziness or visual disturbances. 3. Avoid touching the patch. Wash your hands with soap and water after contact with the patch.   Regional Anesthesia Blocks  1. Numbness or the inability to move the "blocked" extremity may last from 3-48 hours after placement. The length of time depends on the medication injected and your individual response to the medication. If the numbness is not going away after 48 hours, call your surgeon.  2. The extremity that is blocked will need to be protected until the numbness is gone and the  Strength has returned. Because you cannot feel it, you will need to take extra care to avoid injury. Because it may be weak, you may have difficulty moving it or using it. You may not know what position it is in without looking at it while the block is in effect.  3. For blocks in the legs and feet, returning to weight bearing and walking needs to be done carefully. You will need to wait until the numbness is entirely gone and the strength has returned. You should be able to move your leg and foot normally before you try and bear weight or walk. You will need someone to be with you when you first try to ensure you do not fall and possibly risk injury.  4. Bruising and tenderness at the needle site are common side effects and will resolve in a few days.  5. Persistent numbness or new problems with movement should be communicated to the surgeon or the [facility] [phone] [facility] [phone]).

## 2016-04-13 NOTE — Progress Notes (Signed)
Assisted Dr. Rose with left, ultrasound guided, supraclavicular block. Side rails up, monitors on throughout procedure. See vital signs in flow sheet. Tolerated Procedure well. 

## 2016-04-14 NOTE — Anesthesia Postprocedure Evaluation (Signed)
Anesthesia Post Note  Patient: Macario GoldsRuben Fell  Procedure(s) Performed: Procedure(s) (LRB): OPEN REDUCTION INTERNAL FIXATION (ORIF) DISTAL RADIAL FRACTURE (Left)  Patient location during evaluation: PACU Anesthesia Type: General and Regional Level of consciousness: awake and alert Pain management: pain level controlled Vital Signs Assessment: post-procedure vital signs reviewed and stable Respiratory status: spontaneous breathing, nonlabored ventilation, respiratory function stable and patient connected to nasal cannula oxygen Cardiovascular status: blood pressure returned to baseline and stable Postop Assessment: no signs of nausea or vomiting Anesthetic complications: no    Last Vitals:  Vitals:   04/13/16 1245 04/13/16 1315  BP: 133/87 (!) 143/89  Pulse: (!) 58 62  Resp: 10 18  Temp:  36.4 C    Last Pain:  Vitals:   04/13/16 1315  TempSrc:   PainSc: 0-No pain                 Muhsin Doris S

## 2016-04-15 ENCOUNTER — Encounter (HOSPITAL_BASED_OUTPATIENT_CLINIC_OR_DEPARTMENT_OTHER): Payer: Self-pay | Admitting: Orthopedic Surgery

## 2018-01-03 IMAGING — DX DG WRIST COMPLETE 3+V*L*
2 series · 2 of 2 positions shown · non-contrast
Comparison: None.

CLINICAL DATA: 20-year-old male with fall.

EXAM:
LEFT WRIST - COMPLETE 3+ VIEW

[wrist pa]
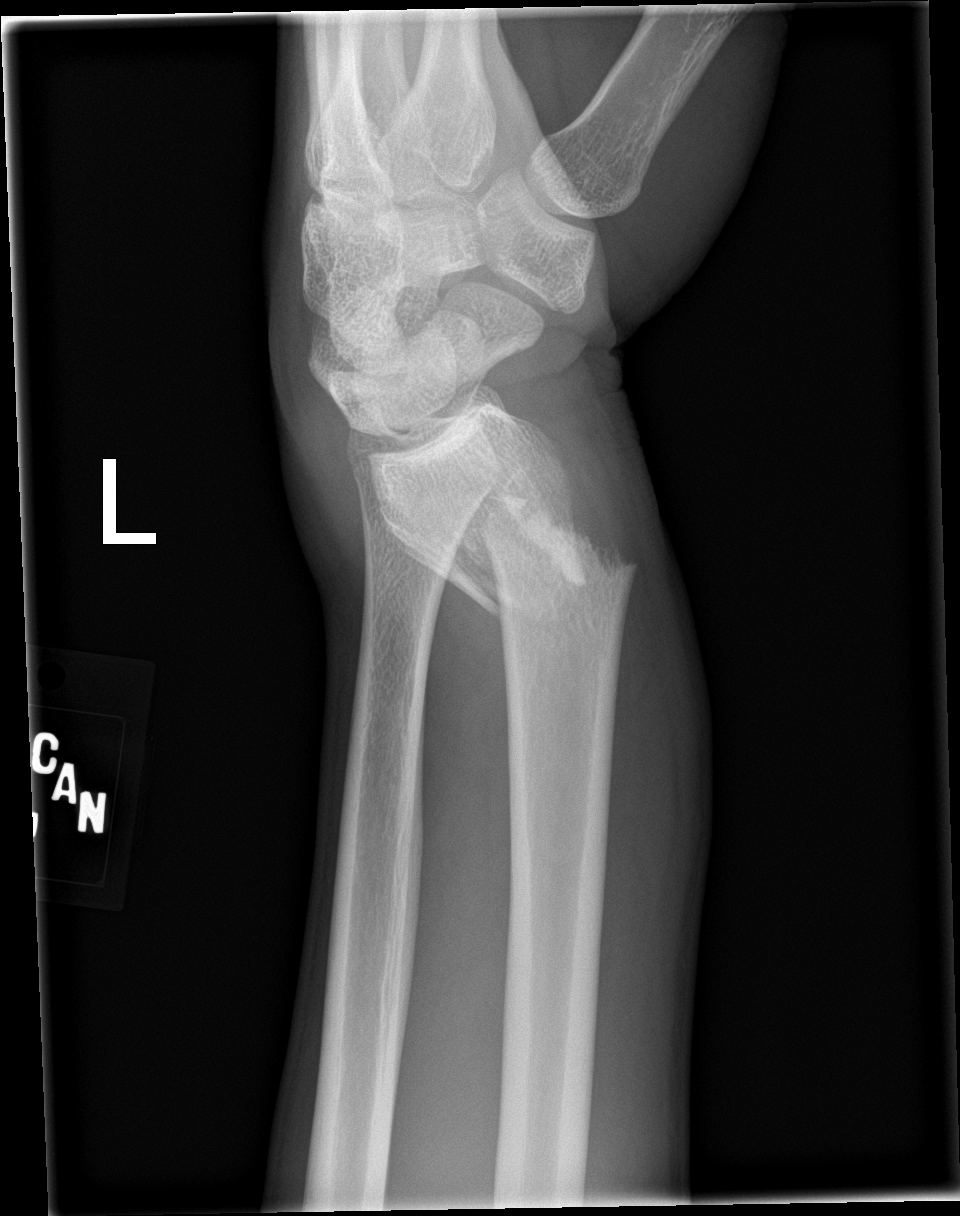

[wrist lat]
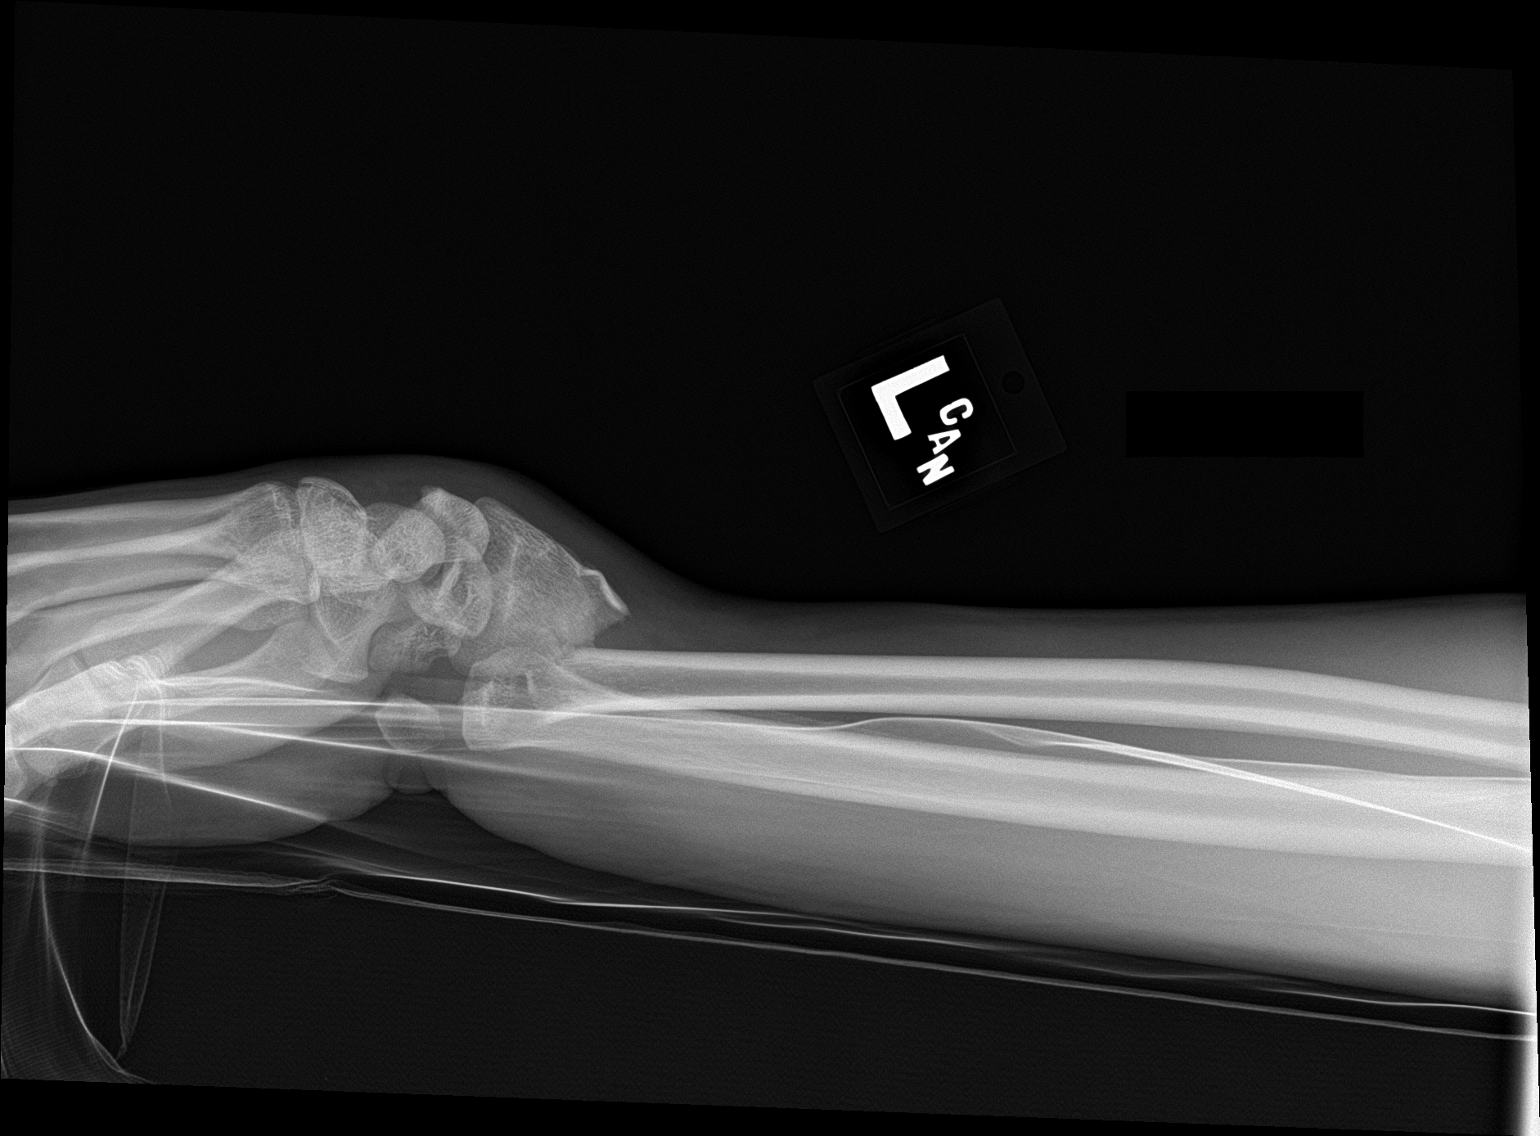

[2 of 2 positions shown; findings below may reference images not displayed]

FINDINGS: There is a fracture of the distal radius with approximately half
shaft thickness dorsal displacement and dorsal angulation of the
distal fracture fragment. No other acute fracture identified. There
is soft tissue swelling of the distal forearm and wrist. No
radiopaque foreign object.
IMPRESSION: Fracture of the distal radius with dorsal displacement and
angulation of the distal fracture fragment.

## 2018-01-03 IMAGING — DX DG WRIST 2V*L*
2 series · 2 of 2 positions shown · non-contrast
Comparison: Left wrist radiographs performed earlier today at [DATE]
a.m.

CLINICAL DATA: Status post reduction of left wrist deformity.
Initial encounter.

EXAM:
LEFT WRIST - 2 VIEW

[wrist pa]
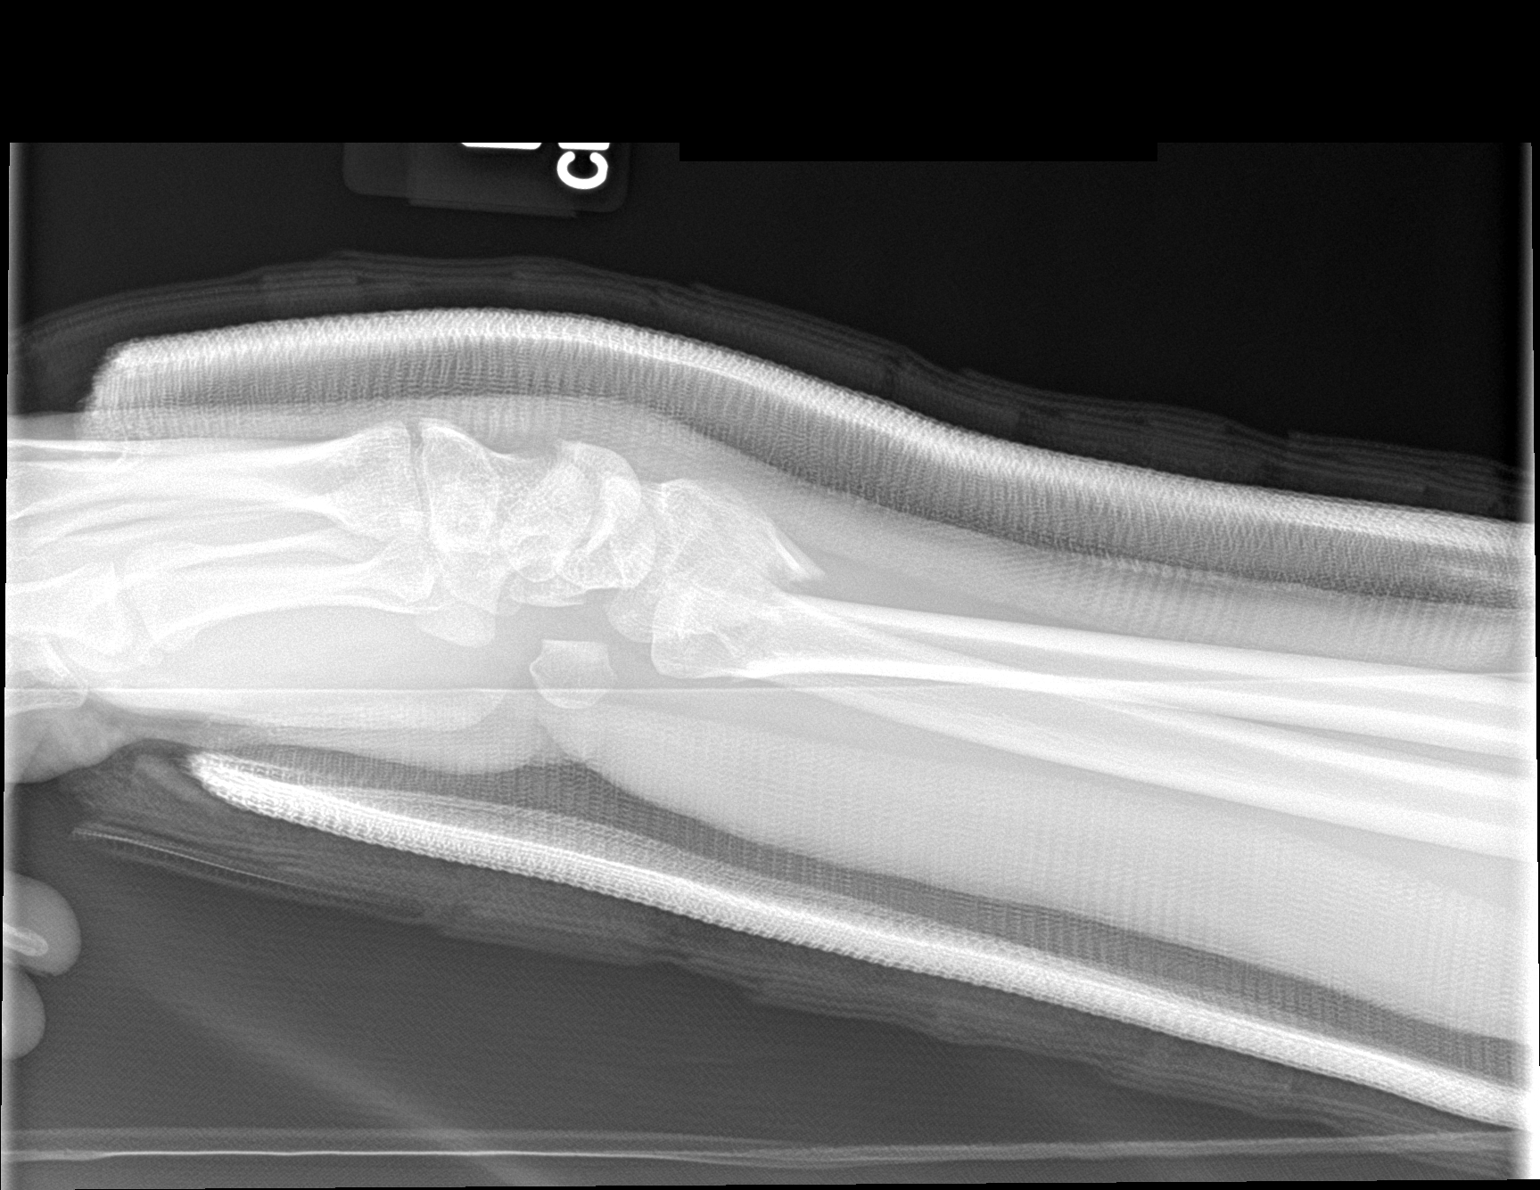

[wrist lat]
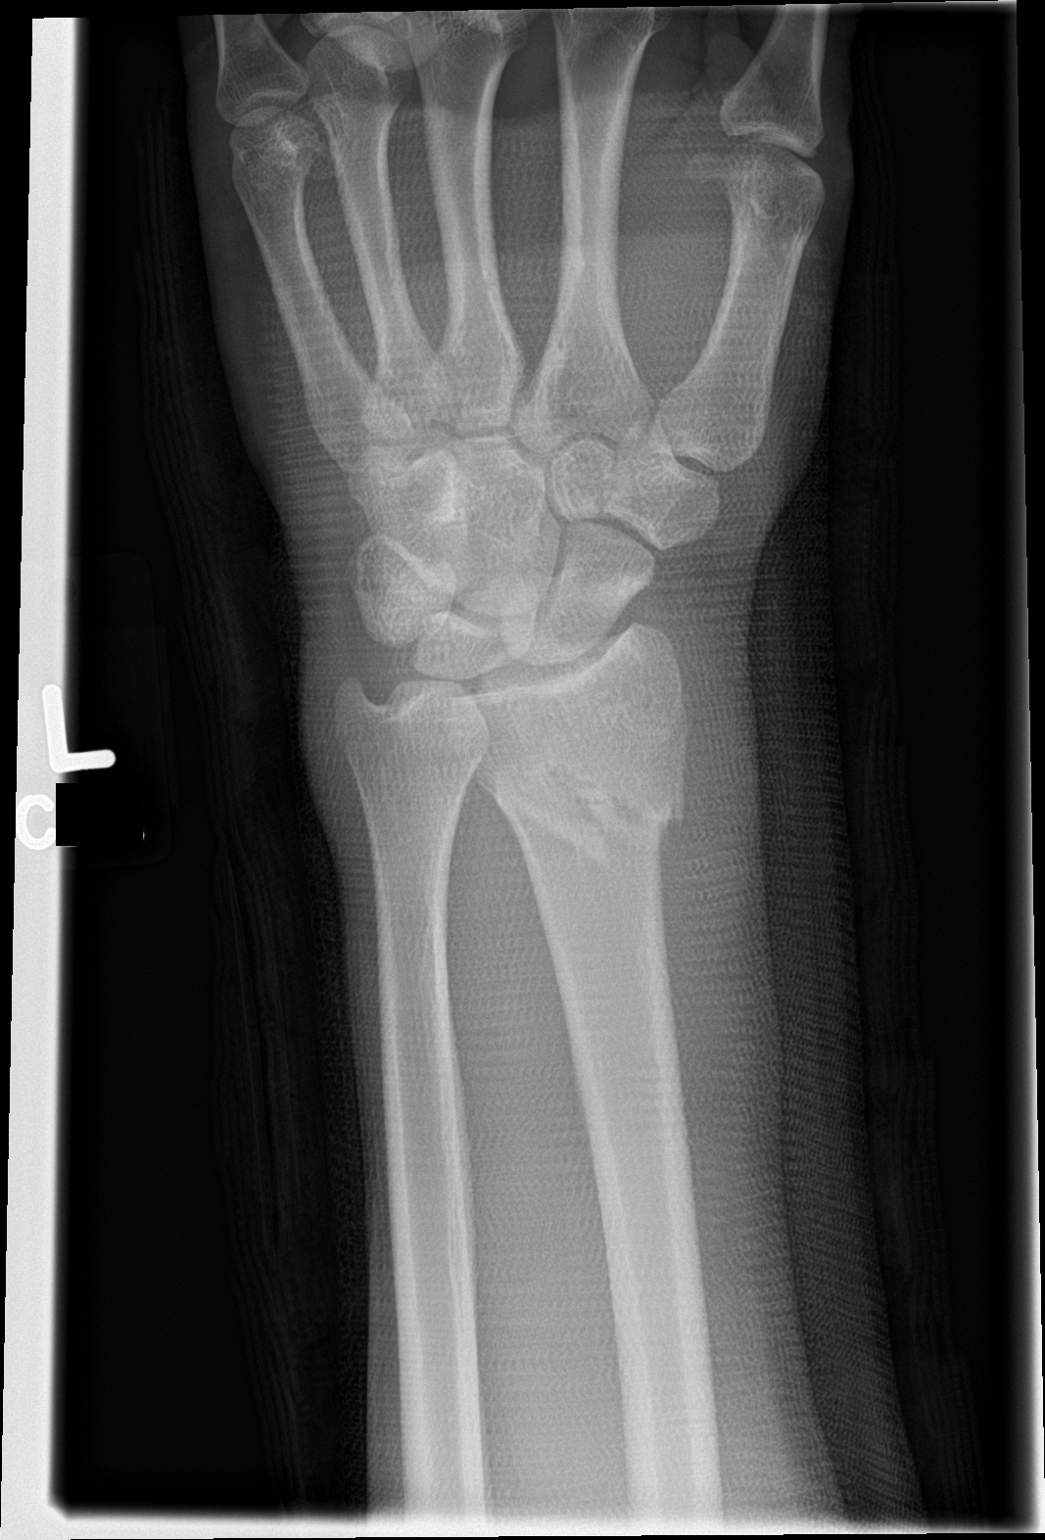

[2 of 2 positions shown; findings below may reference images not displayed]

FINDINGS: There is improved alignment of the distal radial fracture, though
approximately [DATE] shaft width dorsal displacement is still seen,
with mild dorsal angulation and mild impaction.

No new fractures are identified. The carpal rows appear grossly
intact, and demonstrate normal alignment. Mild soft tissue swelling
is noted about the wrist. Evaluation of the soft tissues is somewhat
suboptimal due to the overlying splint.
IMPRESSION: Improved alignment of distal radius fracture, though approximately
[DATE] shaft width dorsal displacement is still seen, with mild dorsal
angulation and mild impaction.
